# Patient Record
Sex: Female | Born: 1935 | Race: White | Hispanic: No | Marital: Married | Smoking: Never smoker
Health system: Southern US, Community
[De-identification: ages and names within clinical notes are randomized; demographics above are authoritative.]

## PROBLEM LIST (undated history)

## (undated) DIAGNOSIS — E079 Disorder of thyroid, unspecified: Secondary | ICD-10-CM

## (undated) DIAGNOSIS — F419 Anxiety disorder, unspecified: Secondary | ICD-10-CM

---

## 2004-09-17 ENCOUNTER — Ambulatory Visit: Payer: Self-pay | Admitting: Internal Medicine

## 2005-10-14 ENCOUNTER — Ambulatory Visit: Payer: Self-pay | Admitting: Internal Medicine

## 2005-10-19 ENCOUNTER — Ambulatory Visit: Payer: Self-pay | Admitting: Internal Medicine

## 2006-01-19 ENCOUNTER — Ambulatory Visit: Payer: Self-pay | Admitting: Internal Medicine

## 2006-01-19 ENCOUNTER — Ambulatory Visit: Payer: Self-pay | Admitting: Nurse Practitioner

## 2006-01-26 ENCOUNTER — Ambulatory Visit: Payer: Self-pay | Admitting: Internal Medicine

## 2006-05-20 ENCOUNTER — Ambulatory Visit: Payer: Self-pay | Admitting: Unknown Physician Specialty

## 2006-10-21 ENCOUNTER — Ambulatory Visit: Payer: Self-pay | Admitting: Internal Medicine

## 2008-01-23 ENCOUNTER — Ambulatory Visit: Payer: Self-pay | Admitting: Internal Medicine

## 2009-01-23 ENCOUNTER — Ambulatory Visit: Payer: Self-pay | Admitting: Internal Medicine

## 2009-02-13 ENCOUNTER — Ambulatory Visit: Payer: Self-pay | Admitting: Unknown Physician Specialty

## 2009-02-27 ENCOUNTER — Ambulatory Visit: Payer: Self-pay | Admitting: Unknown Physician Specialty

## 2009-05-16 ENCOUNTER — Ambulatory Visit: Payer: Self-pay | Admitting: Internal Medicine

## 2010-01-20 ENCOUNTER — Ambulatory Visit: Payer: Self-pay | Admitting: Unknown Physician Specialty

## 2010-01-21 LAB — PATHOLOGY REPORT

## 2010-02-19 ENCOUNTER — Ambulatory Visit: Payer: Self-pay | Admitting: Internal Medicine

## 2011-04-01 ENCOUNTER — Ambulatory Visit: Payer: Self-pay | Admitting: Internal Medicine

## 2011-04-19 ENCOUNTER — Ambulatory Visit: Payer: Self-pay | Admitting: Internal Medicine

## 2011-08-25 ENCOUNTER — Ambulatory Visit: Payer: Self-pay | Admitting: Gastroenterology

## 2011-08-26 ENCOUNTER — Ambulatory Visit: Payer: Self-pay | Admitting: Gastroenterology

## 2011-08-28 ENCOUNTER — Emergency Department: Payer: Self-pay | Admitting: Emergency Medicine

## 2011-08-28 LAB — COMPREHENSIVE METABOLIC PANEL
Albumin: 3.7 g/dL (ref 3.4–5.0)
Alkaline Phosphatase: 40 U/L — ABNORMAL LOW (ref 50–136)
Anion Gap: 8 (ref 7–16)
BUN: 9 mg/dL (ref 7–18)
Calcium, Total: 8.5 mg/dL (ref 8.5–10.1)
Chloride: 102 mmol/L (ref 98–107)
EGFR (African American): >60
EGFR (Non-African Amer.): >60
Potassium: 3.5 mmol/L (ref 3.5–5.1)
SGOT(AST): 23 U/L (ref 15–37)

## 2011-08-28 LAB — URINALYSIS, COMPLETE
Bilirubin,UR: NEGATIVE
Glucose,UR: NEGATIVE mg/dL (ref 0–75)
Leukocyte Esterase: NEGATIVE
Nitrite: NEGATIVE
Ph: 6 (ref 4.5–8.0)
Protein: NEGATIVE
RBC,UR: 1 /HPF (ref 0–5)
Squamous Epithelial: NONE SEEN

## 2011-08-28 LAB — CK TOTAL AND CKMB (NOT AT ARMC): CK, Total: 62 U/L (ref 21–215)

## 2011-08-28 LAB — CBC
HGB: 13.1 g/dL (ref 12.0–16.0)
MCHC: 34.2 g/dL (ref 32.0–36.0)
RBC: 4.11 10*6/uL (ref 3.80–5.20)

## 2011-08-28 LAB — TROPONIN I: Troponin-I: <0.02 ng/mL

## 2011-09-06 ENCOUNTER — Ambulatory Visit: Payer: Self-pay | Admitting: Internal Medicine

## 2011-09-13 ENCOUNTER — Ambulatory Visit: Payer: Self-pay | Admitting: Internal Medicine

## 2012-01-04 ENCOUNTER — Ambulatory Visit: Payer: Self-pay | Admitting: Gastroenterology

## 2012-01-05 LAB — PATHOLOGY REPORT

## 2012-02-07 ENCOUNTER — Ambulatory Visit: Payer: Self-pay | Admitting: Gastroenterology

## 2012-02-16 ENCOUNTER — Ambulatory Visit: Payer: Self-pay | Admitting: Internal Medicine

## 2012-03-27 ENCOUNTER — Ambulatory Visit: Payer: Self-pay | Admitting: Internal Medicine

## 2012-04-19 ENCOUNTER — Ambulatory Visit: Payer: Self-pay | Admitting: Internal Medicine

## 2012-05-09 ENCOUNTER — Ambulatory Visit: Payer: Self-pay | Admitting: Gastroenterology

## 2013-04-20 ENCOUNTER — Ambulatory Visit: Payer: Self-pay | Admitting: Internal Medicine

## 2013-05-06 IMAGING — US ABDOMEN ULTRASOUND
1 series · 17 of 25 positions shown · non-contrast
Comparison: none

REASON FOR EXAM: epigastric abd pain
COMMENTS:

[Series 1: abdomen ultrasound · 17 of 85 slices shown]
[im 1/85]
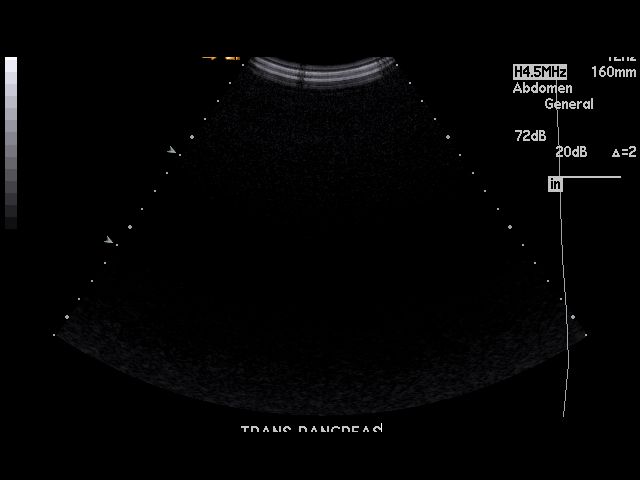
[im 8/85]
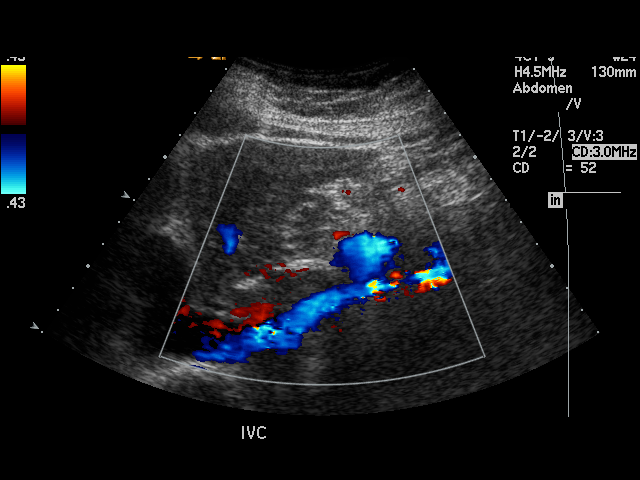
[im 11/85]
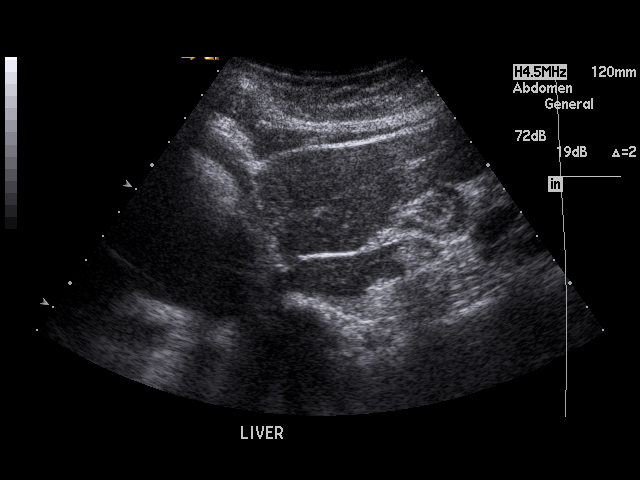
[im 18/85]
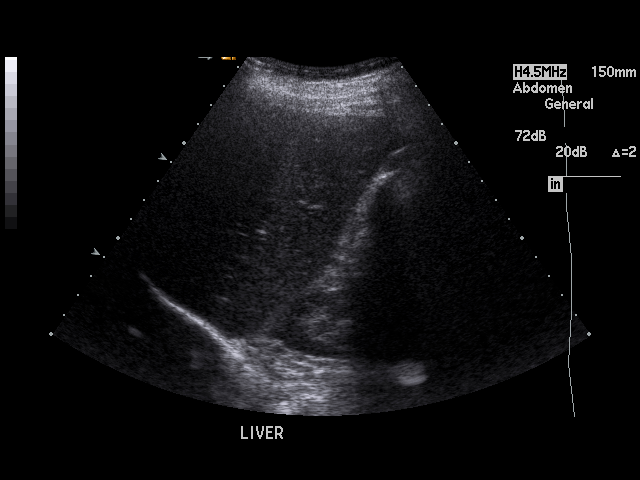
[im 22/85]
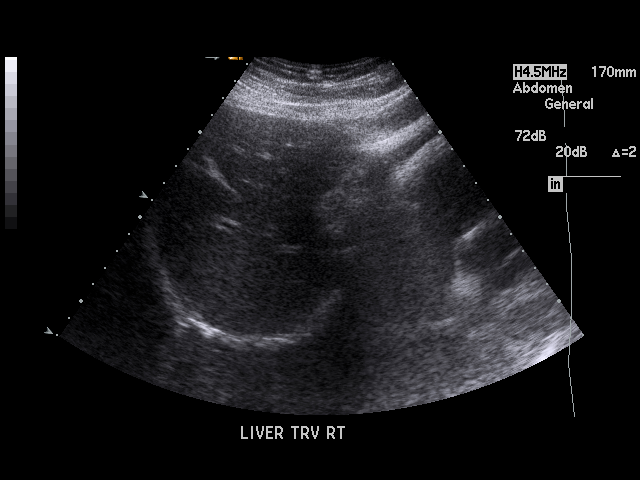
[im 29/85]
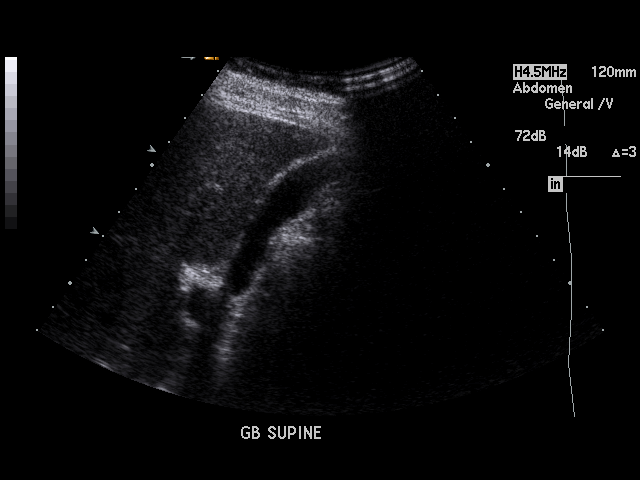
[im 32/85]
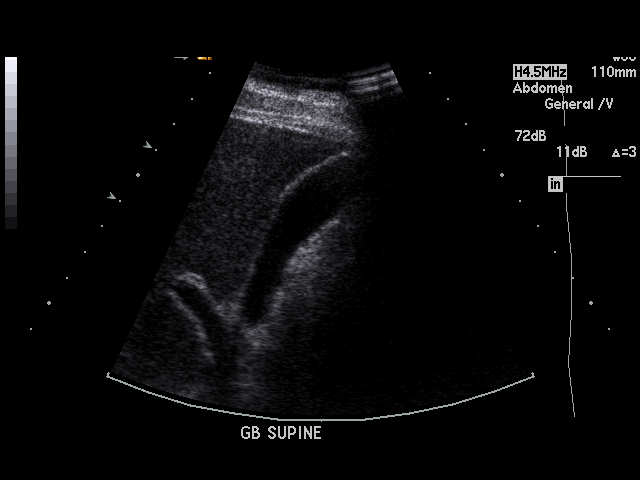
[im 39/85]
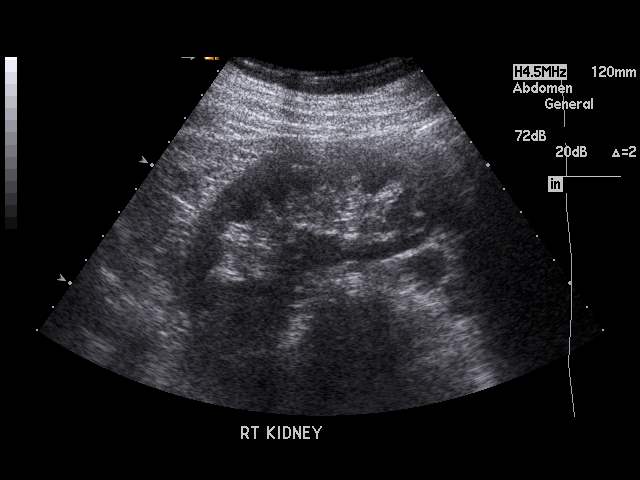
[im 43/85]
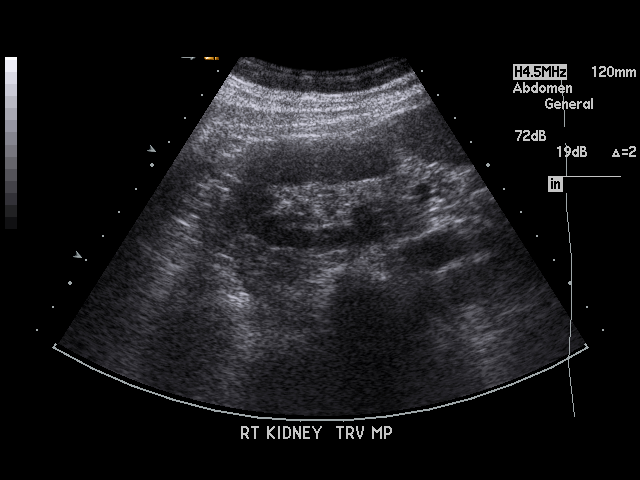
[im 46/85]
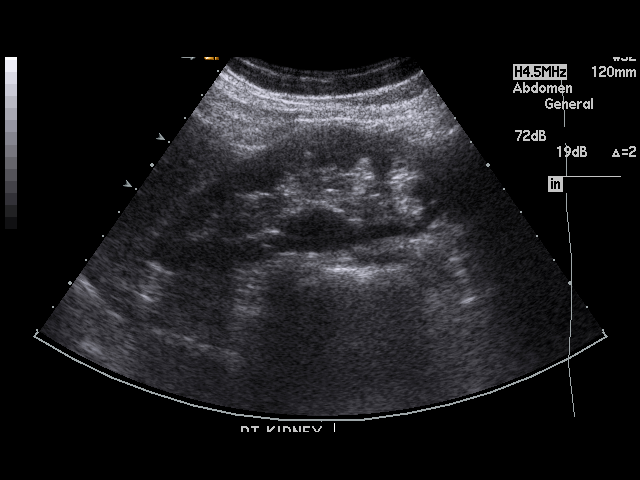
[im 53/85]
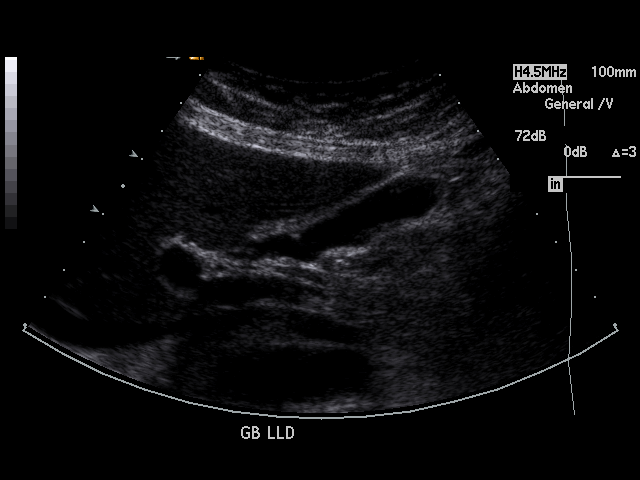
[im 57/85]
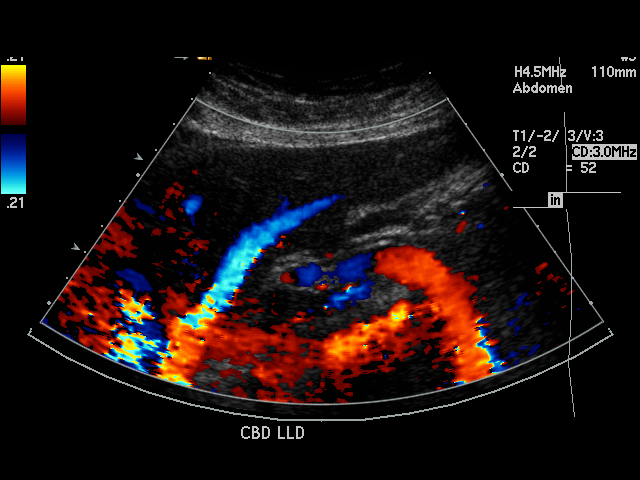
[im 64/85]
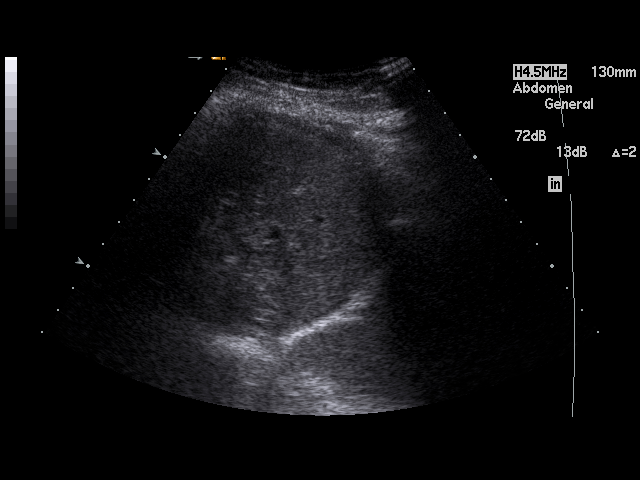
[im 67/85]
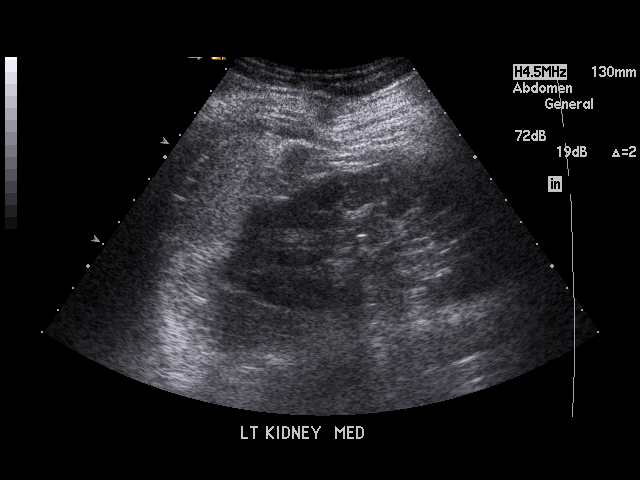
[im 74/85]
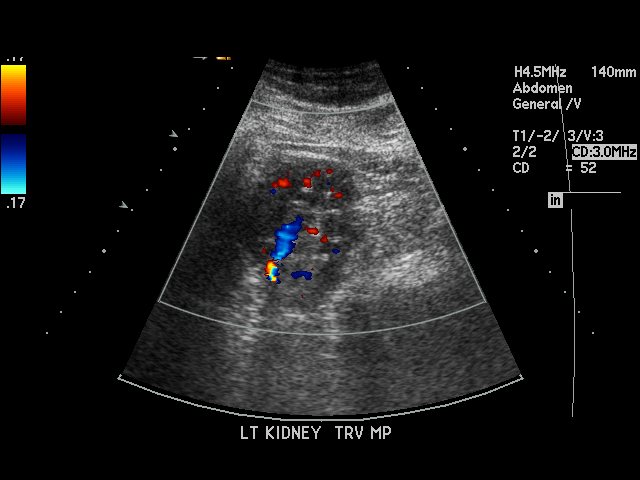
[im 78/85]
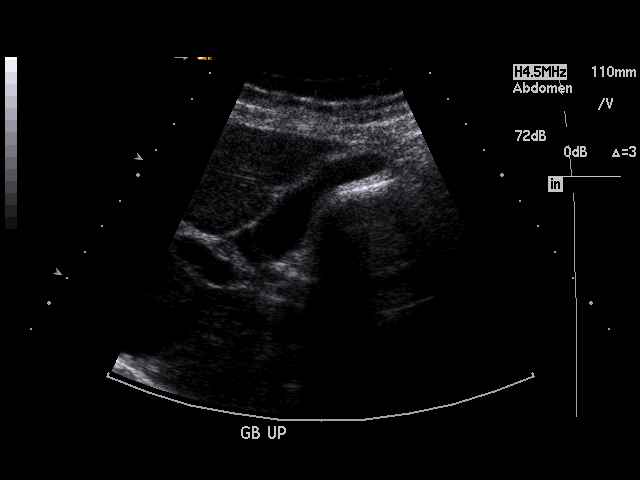
[im 85/85]
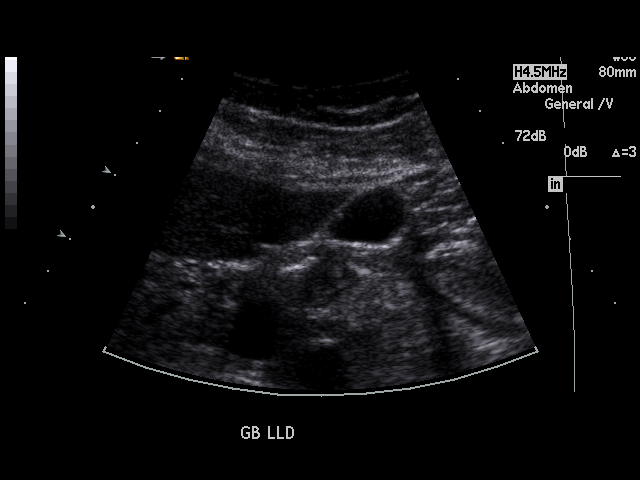

[17 of 25 positions shown; findings below may reference images not displayed]

PROCEDURE:     US  - US ABDOMEN GENERAL SURVEY  - August 25, 2011  [DATE]

RESULT:     The liver, spleen, pancreas, abdominal aorta and inferior vena
cava show no significant abnormalities. There is a 2.2 mm nonshadowing
nonmobile echodensity in the gallbladder compatible a small polyp. No
gallstones are seen. There is no thickening of the gallbladder wall. Common
bile duct measures 2.6 mm in diameter which is within normal limits. The
kidneys show no hydronephrosis. Sagittally, the right kidney measures
cm and the left measures 10.77 cm. No ascites is seen.
IMPRESSION: 1. There is a tiny nonshadowing echodensity in the gallbladder compatible
with a 2.2 mm polyp.
2. No gallstones or other acute change is identified.

## 2013-05-07 IMAGING — NM NUCLEAR MEDICINE HEPATOHBILIARY INCLUDE GB
2 series · 13 of 13 positions shown · non-contrast
Comparison: none

REASON FOR EXAM: epigastric abd pain
COMMENTS:

[Series 1000: gallbladder ef · 4.80mm/px · 6 of 30 frames shown]
[frame 3/30]
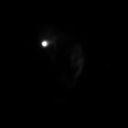
[frame 8/30]
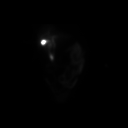
[frame 13/30]
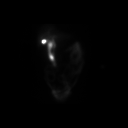
[frame 18/30]
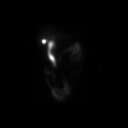
[frame 23/30]
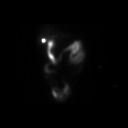
[frame 28/30]
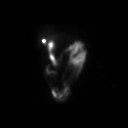

[Series 1000: gallbladder statics · 4.80mm/px · 7 of 7 slices shown]
[im 1/7]
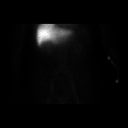
[im 2/7]
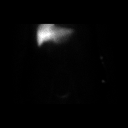
[im 3/7]
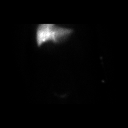
[im 4/7]
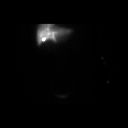
[im 5/7]
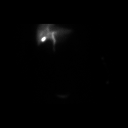
[im 6/7]
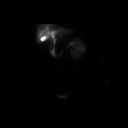
[im 7/7]
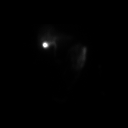

[13 of 13 positions shown; findings below may reference images not displayed]

PROCEDURE:     NM  - NM HEPATO WITH GB EJECT FRACTION  - August 26, 2011 [DATE]

RESULT:     Following intravenous administration of 7.87 mCi technetium 99m
Choletec, there is observed prompt visualization of tracer activity in the
liver at 3 minutes. At 40 minutes, tracer activity is visualized in the
gallbladder, common duct and proximal small bowel.

The gallbladder ejection fraction measures 90% which is within the normal
range.
IMPRESSION: 1. Normal hepatobiliary scan.
2. The gallbladder ejection fraction measures 90% which is in the normal
range.

## 2014-07-15 ENCOUNTER — Ambulatory Visit: Payer: Self-pay | Admitting: Internal Medicine

## 2014-07-15 DIAGNOSIS — Z1231 Encounter for screening mammogram for malignant neoplasm of breast: Secondary | ICD-10-CM | POA: Diagnosis not present

## 2014-10-11 DIAGNOSIS — K210 Gastro-esophageal reflux disease with esophagitis: Secondary | ICD-10-CM | POA: Diagnosis not present

## 2014-10-17 DIAGNOSIS — M546 Pain in thoracic spine: Secondary | ICD-10-CM | POA: Diagnosis not present

## 2014-10-17 DIAGNOSIS — Z79899 Other long term (current) drug therapy: Secondary | ICD-10-CM | POA: Diagnosis not present

## 2014-10-17 DIAGNOSIS — M791 Myalgia: Secondary | ICD-10-CM | POA: Diagnosis not present

## 2014-10-17 DIAGNOSIS — E039 Hypothyroidism, unspecified: Secondary | ICD-10-CM | POA: Diagnosis not present

## 2014-10-17 DIAGNOSIS — F419 Anxiety disorder, unspecified: Secondary | ICD-10-CM | POA: Diagnosis not present

## 2014-10-17 DIAGNOSIS — E780 Pure hypercholesterolemia: Secondary | ICD-10-CM | POA: Diagnosis not present

## 2014-12-03 DIAGNOSIS — F329 Major depressive disorder, single episode, unspecified: Secondary | ICD-10-CM | POA: Diagnosis not present

## 2014-12-05 DIAGNOSIS — E039 Hypothyroidism, unspecified: Secondary | ICD-10-CM | POA: Diagnosis not present

## 2014-12-05 DIAGNOSIS — Z79899 Other long term (current) drug therapy: Secondary | ICD-10-CM | POA: Diagnosis not present

## 2014-12-05 DIAGNOSIS — I10 Essential (primary) hypertension: Secondary | ICD-10-CM | POA: Diagnosis not present

## 2014-12-05 DIAGNOSIS — E785 Hyperlipidemia, unspecified: Secondary | ICD-10-CM | POA: Diagnosis not present

## 2014-12-12 DIAGNOSIS — E039 Hypothyroidism, unspecified: Secondary | ICD-10-CM | POA: Diagnosis not present

## 2014-12-12 DIAGNOSIS — I10 Essential (primary) hypertension: Secondary | ICD-10-CM | POA: Diagnosis not present

## 2014-12-12 DIAGNOSIS — R252 Cramp and spasm: Secondary | ICD-10-CM | POA: Diagnosis not present

## 2014-12-12 DIAGNOSIS — E780 Pure hypercholesterolemia: Secondary | ICD-10-CM | POA: Diagnosis not present

## 2015-03-06 DIAGNOSIS — E780 Pure hypercholesterolemia: Secondary | ICD-10-CM | POA: Diagnosis not present

## 2015-03-06 DIAGNOSIS — E039 Hypothyroidism, unspecified: Secondary | ICD-10-CM | POA: Diagnosis not present

## 2015-03-12 DIAGNOSIS — H2513 Age-related nuclear cataract, bilateral: Secondary | ICD-10-CM | POA: Diagnosis not present

## 2015-03-13 ENCOUNTER — Other Ambulatory Visit: Payer: Self-pay | Admitting: Internal Medicine

## 2015-03-13 DIAGNOSIS — F419 Anxiety disorder, unspecified: Secondary | ICD-10-CM | POA: Diagnosis not present

## 2015-03-13 DIAGNOSIS — R102 Pelvic and perineal pain unspecified side: Secondary | ICD-10-CM

## 2015-03-13 DIAGNOSIS — E780 Pure hypercholesterolemia: Secondary | ICD-10-CM | POA: Diagnosis not present

## 2015-03-13 DIAGNOSIS — R1084 Generalized abdominal pain: Secondary | ICD-10-CM | POA: Diagnosis not present

## 2015-03-13 DIAGNOSIS — R079 Chest pain, unspecified: Secondary | ICD-10-CM | POA: Diagnosis not present

## 2015-03-13 DIAGNOSIS — I10 Essential (primary) hypertension: Secondary | ICD-10-CM | POA: Diagnosis not present

## 2015-03-13 DIAGNOSIS — E039 Hypothyroidism, unspecified: Secondary | ICD-10-CM | POA: Diagnosis not present

## 2015-03-19 ENCOUNTER — Ambulatory Visit
Admission: RE | Admit: 2015-03-19 | Discharge: 2015-03-19 | Disposition: A | Discharge status: Home / Self Care | Payer: Commercial Managed Care - HMO | Source: Ambulatory Visit | Attending: Internal Medicine | Admitting: Internal Medicine

## 2015-03-19 DIAGNOSIS — R1084 Generalized abdominal pain: Secondary | ICD-10-CM | POA: Diagnosis not present

## 2015-03-19 DIAGNOSIS — M5137 Other intervertebral disc degeneration, lumbosacral region: Secondary | ICD-10-CM | POA: Insufficient documentation

## 2015-03-19 DIAGNOSIS — R102 Pelvic and perineal pain: Secondary | ICD-10-CM | POA: Diagnosis not present

## 2015-03-19 DIAGNOSIS — R197 Diarrhea, unspecified: Secondary | ICD-10-CM | POA: Diagnosis not present

## 2015-03-19 DIAGNOSIS — R1010 Upper abdominal pain, unspecified: Secondary | ICD-10-CM | POA: Diagnosis not present

## 2015-03-19 DIAGNOSIS — M5136 Other intervertebral disc degeneration, lumbar region: Secondary | ICD-10-CM | POA: Insufficient documentation

## 2015-03-19 DIAGNOSIS — N83201 Unspecified ovarian cyst, right side: Secondary | ICD-10-CM | POA: Diagnosis not present

## 2015-03-19 LAB — POCT I-STAT CREATININE: CREATININE: 0.7 mg/dL (ref 0.44–1.00)

## 2015-03-19 MED ORDER — IOHEXOL 300 MG/ML  SOLN
100.0000 mL | Freq: Once | INTRAMUSCULAR | Status: AC | PRN
  Administered 2015-03-19: 100 mL via INTRAVENOUS

## 2015-03-21 DIAGNOSIS — Z23 Encounter for immunization: Secondary | ICD-10-CM | POA: Diagnosis not present

## 2015-03-25 DIAGNOSIS — F329 Major depressive disorder, single episode, unspecified: Secondary | ICD-10-CM | POA: Diagnosis not present

## 2015-04-07 DIAGNOSIS — E782 Mixed hyperlipidemia: Secondary | ICD-10-CM | POA: Diagnosis not present

## 2015-04-07 DIAGNOSIS — R079 Chest pain, unspecified: Secondary | ICD-10-CM | POA: Diagnosis not present

## 2015-04-15 DIAGNOSIS — R079 Chest pain, unspecified: Secondary | ICD-10-CM | POA: Diagnosis not present

## 2015-05-12 ENCOUNTER — Encounter: Payer: Self-pay | Admitting: Family

## 2015-08-28 DIAGNOSIS — E039 Hypothyroidism, unspecified: Secondary | ICD-10-CM | POA: Diagnosis not present

## 2015-08-28 DIAGNOSIS — Z79899 Other long term (current) drug therapy: Secondary | ICD-10-CM | POA: Diagnosis not present

## 2015-08-28 DIAGNOSIS — E7800 Pure hypercholesterolemia, unspecified: Secondary | ICD-10-CM | POA: Diagnosis not present

## 2015-09-03 DIAGNOSIS — E039 Hypothyroidism, unspecified: Secondary | ICD-10-CM | POA: Diagnosis not present

## 2015-09-03 DIAGNOSIS — E7800 Pure hypercholesterolemia, unspecified: Secondary | ICD-10-CM | POA: Diagnosis not present

## 2015-09-03 DIAGNOSIS — Z1211 Encounter for screening for malignant neoplasm of colon: Secondary | ICD-10-CM | POA: Diagnosis not present

## 2015-09-03 DIAGNOSIS — I10 Essential (primary) hypertension: Secondary | ICD-10-CM | POA: Diagnosis not present

## 2015-09-03 DIAGNOSIS — Z1239 Encounter for other screening for malignant neoplasm of breast: Secondary | ICD-10-CM | POA: Diagnosis not present

## 2015-09-04 ENCOUNTER — Other Ambulatory Visit: Payer: Self-pay | Admitting: Internal Medicine

## 2015-09-04 DIAGNOSIS — Z1231 Encounter for screening mammogram for malignant neoplasm of breast: Secondary | ICD-10-CM

## 2015-09-08 DIAGNOSIS — Z1211 Encounter for screening for malignant neoplasm of colon: Secondary | ICD-10-CM | POA: Diagnosis not present

## 2015-09-22 ENCOUNTER — Other Ambulatory Visit: Payer: Self-pay | Admitting: Internal Medicine

## 2015-09-22 ENCOUNTER — Ambulatory Visit
Admission: RE | Admit: 2015-09-22 | Discharge: 2015-09-22 | Disposition: A | Discharge status: Home / Self Care | Payer: Commercial Managed Care - HMO | Source: Ambulatory Visit | Attending: Internal Medicine | Admitting: Internal Medicine

## 2015-09-22 DIAGNOSIS — Z1231 Encounter for screening mammogram for malignant neoplasm of breast: Secondary | ICD-10-CM | POA: Diagnosis not present

## 2015-09-30 DIAGNOSIS — F329 Major depressive disorder, single episode, unspecified: Secondary | ICD-10-CM | POA: Diagnosis not present

## 2015-12-23 DIAGNOSIS — F329 Major depressive disorder, single episode, unspecified: Secondary | ICD-10-CM | POA: Diagnosis not present

## 2016-01-21 DIAGNOSIS — F329 Major depressive disorder, single episode, unspecified: Secondary | ICD-10-CM | POA: Diagnosis not present

## 2016-02-04 DIAGNOSIS — G4733 Obstructive sleep apnea (adult) (pediatric): Secondary | ICD-10-CM | POA: Diagnosis not present

## 2016-02-04 DIAGNOSIS — R0683 Snoring: Secondary | ICD-10-CM | POA: Diagnosis not present

## 2016-02-04 DIAGNOSIS — G4700 Insomnia, unspecified: Secondary | ICD-10-CM | POA: Diagnosis not present

## 2016-02-04 DIAGNOSIS — R5383 Other fatigue: Secondary | ICD-10-CM | POA: Diagnosis not present

## 2016-03-09 DIAGNOSIS — Z79899 Other long term (current) drug therapy: Secondary | ICD-10-CM | POA: Diagnosis not present

## 2016-03-09 DIAGNOSIS — E039 Hypothyroidism, unspecified: Secondary | ICD-10-CM | POA: Diagnosis not present

## 2016-03-09 DIAGNOSIS — I10 Essential (primary) hypertension: Secondary | ICD-10-CM | POA: Diagnosis not present

## 2016-03-09 DIAGNOSIS — F419 Anxiety disorder, unspecified: Secondary | ICD-10-CM | POA: Diagnosis not present

## 2016-03-09 DIAGNOSIS — E7800 Pure hypercholesterolemia, unspecified: Secondary | ICD-10-CM | POA: Diagnosis not present

## 2016-03-09 DIAGNOSIS — Z0000 Encounter for general adult medical examination without abnormal findings: Secondary | ICD-10-CM | POA: Diagnosis not present

## 2016-03-18 DIAGNOSIS — F329 Major depressive disorder, single episode, unspecified: Secondary | ICD-10-CM | POA: Diagnosis not present

## 2016-05-20 DIAGNOSIS — F329 Major depressive disorder, single episode, unspecified: Secondary | ICD-10-CM | POA: Diagnosis not present

## 2016-06-29 DIAGNOSIS — F329 Major depressive disorder, single episode, unspecified: Secondary | ICD-10-CM | POA: Diagnosis not present

## 2016-08-10 DIAGNOSIS — I10 Essential (primary) hypertension: Secondary | ICD-10-CM | POA: Diagnosis not present

## 2016-08-10 DIAGNOSIS — G4733 Obstructive sleep apnea (adult) (pediatric): Secondary | ICD-10-CM | POA: Diagnosis not present

## 2016-08-10 DIAGNOSIS — F5104 Psychophysiologic insomnia: Secondary | ICD-10-CM | POA: Diagnosis not present

## 2016-08-26 DIAGNOSIS — G4733 Obstructive sleep apnea (adult) (pediatric): Secondary | ICD-10-CM | POA: Diagnosis not present

## 2016-09-21 DIAGNOSIS — F329 Major depressive disorder, single episode, unspecified: Secondary | ICD-10-CM | POA: Diagnosis not present

## 2016-10-04 DIAGNOSIS — E039 Hypothyroidism, unspecified: Secondary | ICD-10-CM | POA: Diagnosis not present

## 2016-10-04 DIAGNOSIS — Z79899 Other long term (current) drug therapy: Secondary | ICD-10-CM | POA: Diagnosis not present

## 2016-10-04 DIAGNOSIS — E7800 Pure hypercholesterolemia, unspecified: Secondary | ICD-10-CM | POA: Diagnosis not present

## 2016-10-04 DIAGNOSIS — Z0000 Encounter for general adult medical examination without abnormal findings: Secondary | ICD-10-CM | POA: Diagnosis not present

## 2016-10-04 DIAGNOSIS — I10 Essential (primary) hypertension: Secondary | ICD-10-CM | POA: Diagnosis not present

## 2016-10-11 DIAGNOSIS — Z1211 Encounter for screening for malignant neoplasm of colon: Secondary | ICD-10-CM | POA: Diagnosis not present

## 2016-10-11 DIAGNOSIS — Z0000 Encounter for general adult medical examination without abnormal findings: Secondary | ICD-10-CM | POA: Diagnosis not present

## 2016-10-11 DIAGNOSIS — E039 Hypothyroidism, unspecified: Secondary | ICD-10-CM | POA: Diagnosis not present

## 2016-10-11 DIAGNOSIS — E7800 Pure hypercholesterolemia, unspecified: Secondary | ICD-10-CM | POA: Diagnosis not present

## 2016-10-11 DIAGNOSIS — G4733 Obstructive sleep apnea (adult) (pediatric): Secondary | ICD-10-CM | POA: Diagnosis not present

## 2016-10-11 DIAGNOSIS — F419 Anxiety disorder, unspecified: Secondary | ICD-10-CM | POA: Diagnosis not present

## 2016-10-11 DIAGNOSIS — Z1231 Encounter for screening mammogram for malignant neoplasm of breast: Secondary | ICD-10-CM | POA: Diagnosis not present

## 2016-10-11 DIAGNOSIS — F5104 Psychophysiologic insomnia: Secondary | ICD-10-CM | POA: Diagnosis not present

## 2016-10-11 DIAGNOSIS — I10 Essential (primary) hypertension: Secondary | ICD-10-CM | POA: Diagnosis not present

## 2016-10-14 ENCOUNTER — Other Ambulatory Visit: Payer: Self-pay | Admitting: Internal Medicine

## 2016-10-14 DIAGNOSIS — Z1231 Encounter for screening mammogram for malignant neoplasm of breast: Secondary | ICD-10-CM

## 2016-10-19 DIAGNOSIS — Z1211 Encounter for screening for malignant neoplasm of colon: Secondary | ICD-10-CM | POA: Diagnosis not present

## 2016-10-28 DIAGNOSIS — F329 Major depressive disorder, single episode, unspecified: Secondary | ICD-10-CM | POA: Diagnosis not present

## 2016-11-15 ENCOUNTER — Ambulatory Visit
Admission: RE | Admit: 2016-11-15 | Discharge: 2016-11-15 | Disposition: A | Discharge status: Home / Self Care | Payer: Commercial Managed Care - HMO | Source: Ambulatory Visit | Attending: Internal Medicine | Admitting: Internal Medicine

## 2016-11-15 DIAGNOSIS — Z1231 Encounter for screening mammogram for malignant neoplasm of breast: Secondary | ICD-10-CM | POA: Insufficient documentation

## 2017-01-19 ENCOUNTER — Emergency Department: Payer: Medicare HMO

## 2017-01-19 ENCOUNTER — Emergency Department
Admission: EM | Admit: 2017-01-19 | Discharge: 2017-01-19 | Disposition: A | Discharge status: Home / Self Care | Payer: Medicare HMO | Attending: Emergency Medicine | Admitting: Emergency Medicine

## 2017-01-19 DIAGNOSIS — R110 Nausea: Secondary | ICD-10-CM | POA: Diagnosis not present

## 2017-01-19 DIAGNOSIS — R42 Dizziness and giddiness: Secondary | ICD-10-CM | POA: Insufficient documentation

## 2017-01-19 DIAGNOSIS — E039 Hypothyroidism, unspecified: Secondary | ICD-10-CM | POA: Diagnosis not present

## 2017-01-19 DIAGNOSIS — I10 Essential (primary) hypertension: Secondary | ICD-10-CM | POA: Insufficient documentation

## 2017-01-19 HISTORY — DX: Disorder of thyroid, unspecified: E07.9

## 2017-01-19 HISTORY — DX: Anxiety disorder, unspecified: F41.9

## 2017-01-19 LAB — BASIC METABOLIC PANEL
Anion gap: 11 (ref 5–15)
BUN: 8 mg/dL (ref 6–20)
CALCIUM: 9.4 mg/dL (ref 8.9–10.3)
CO2: 23 mmol/L (ref 22–32)
Chloride: 102 mmol/L (ref 101–111)
Creatinine, Ser: 0.67 mg/dL (ref 0.44–1.00)
GFR calc non Af Amer: >60 mL/min (ref >60)
Glucose, Bld: 126 mg/dL — ABNORMAL HIGH (ref 65–99)
Potassium: 3.9 mmol/L (ref 3.5–5.1)
SODIUM: 136 mmol/L (ref 135–145)

## 2017-01-19 LAB — CBC
HCT: 40.3 % (ref 35.0–47.0)
Hemoglobin: 13.4 g/dL (ref 12.0–16.0)
MCH: 30.3 pg (ref 26.0–34.0)
MCHC: 33.3 g/dL (ref 32.0–36.0)
MCV: 91.2 fL (ref 80.0–100.0)
PLATELETS: 167 10*3/uL (ref 150–440)
RBC: 4.42 MIL/uL (ref 3.80–5.20)
RDW: 13.2 % (ref 11.5–14.5)
WBC: 5.4 10*3/uL (ref 3.6–11.0)

## 2017-01-19 LAB — URINALYSIS, COMPLETE (UACMP) WITH MICROSCOPIC
BILIRUBIN URINE: NEGATIVE
Bacteria, UA: NONE SEEN
GLUCOSE, UA: NEGATIVE mg/dL
Hgb urine dipstick: NEGATIVE
KETONES UR: NEGATIVE mg/dL
LEUKOCYTES UA: NEGATIVE
Nitrite: NEGATIVE
PH: 7 (ref 5.0–8.0)
Protein, ur: NEGATIVE mg/dL
RBC / HPF: NONE SEEN RBC/hpf (ref 0–5)
SPECIFIC GRAVITY, URINE: 1.006 (ref 1.005–1.030)
Squamous Epithelial / LPF: NONE SEEN

## 2017-01-19 MED ORDER — ONDANSETRON HCL 4 MG/2ML IJ SOLN
4.0000 mg | Freq: Once | INTRAMUSCULAR | Status: AC
  Administered 2017-01-19: 4 mg via INTRAVENOUS
  Filled 2017-01-19: qty 2

## 2017-01-19 MED ORDER — MECLIZINE HCL 25 MG PO TABS
25.0000 mg | ORAL_TABLET | Freq: Once | ORAL | Status: AC
  Administered 2017-01-19: 25 mg via ORAL
  Filled 2017-01-19: qty 1

## 2017-01-19 MED ORDER — DIAZEPAM 5 MG PO TABS
5.0000 mg | ORAL_TABLET | Freq: Once | ORAL | Status: AC
  Administered 2017-01-19: 5 mg via ORAL
  Filled 2017-01-19: qty 1

## 2017-01-19 MED ORDER — DIAZEPAM 2 MG PO TABS: 2.0000 mg | ORAL_TABLET | Freq: Three times a day (TID) | ORAL | 0 refills | Status: AC | PRN

## 2017-01-19 MED ORDER — SODIUM CHLORIDE 0.9 % IV BOLUS (SEPSIS)
1000.0000 mL | Freq: Once | INTRAVENOUS | Status: AC
  Administered 2017-01-19: 1000 mL via INTRAVENOUS

## 2017-01-19 MED ORDER — MECLIZINE HCL 25 MG PO TABS: 25.0000 mg | ORAL_TABLET | Freq: Three times a day (TID) | ORAL | 0 refills | Status: AC | PRN

## 2017-01-19 NOTE — ED Notes (Signed)
Patient transported to MRI 

## 2017-01-19 NOTE — ED Notes (Signed)
Pt discharged to home.  Family member driving.  Discharge instructions reviewed.  Verbalized understanding.  No questions or concerns at this time.  Teach back verified.  Pt in NAD.  No items left in ED.   Signature pad not working, pt verbalized understanding.

## 2017-01-19 NOTE — ED Notes (Signed)
Patient transported to CT 

## 2017-01-19 NOTE — Discharge Instructions (Signed)
Take meclizine 3 times a day as needed for vertigo. You may take Valium as prescribed however this medication is very sedating and should be taken with caution. Follow up with ENT in a week. Performed the exercises given to you as needed. Return to the emergency room if you have headache, facial droop, slurred speech, difficulty finding words, or weakness or numbness of one side of her body.

## 2017-01-19 NOTE — ED Provider Notes (Signed)
[facility] Emergency Department Provider Note  ____________________________________________  Time seen: Approximately 12:57 PM  I have reviewed the triage vital signs and the nursing notes.   HISTORY  Chief Complaint Dizziness   HPI Shirley Ellis is a 81 y.o. female with a history of OSA, hypothyroidism, hypertension, hyperlipidemia, anxiety who presents for evaluation of vertigo. Patient reports that she went for a walk with her husband. She came back in the house at 9:30 AM. She started feeling nauseated and lay on the couch. When she tried to get up she started having vertigo and was unable to walk. That made her nausea worse. No vomiting, no facial droop, no slurred speech, no difficulty finding words, no dysarthria, diplopia, dysphagia, or unilateral weakness or numbness. Patient has a history of smoking however quit several years ago. No personal or family history of stroke. Vertigo resolves at rest and recurs with movement. Nausea has been constant.  Past Medical History:  Diagnosis Date  . Anxiety   . Thyroid disease     There are no active problems to display for this patient.   History reviewed. No pertinent surgical history.  Prior to Admission medications   Medication Sig Start Date End Date Taking? Authorizing Provider  diazepam (VALIUM) 2 MG tablet Take 1 tablet (2 mg total) by mouth every 8 (eight) hours as needed for anxiety. 01/19/17 01/19/18  Nita SickleVeronese, [place], MD  meclizine (ANTIVERT) 25 MG tablet Take 1 tablet (25 mg total) by mouth 3 (three) times daily as needed for dizziness. 01/19/17   Nita SickleVeronese, Melba, MD    Allergies Patient has no known allergies.  Family History  Problem Relation Age of Onset  . Breast cancer Other     Social History Social History  Substance Use Topics  . Smoking status: Never Smoker  . Smokeless tobacco: Current User  . Alcohol use No    Review of Systems  Constitutional: Negative for  fever. Eyes: Negative for visual changes. ENT: Negative for sore throat. Neck: No neck pain  Cardiovascular: Negative for chest pain. Respiratory: Negative for shortness of breath. Gastrointestinal: Negative for abdominal pain, vomiting or diarrhea. + nausea Genitourinary: Negative for dysuria. Musculoskeletal: Negative for back pain. Skin: Negative for rash. Neurological: Negative for headaches, weakness or numbness. + vertigo Psych: No SI or HI  ____________________________________________   PHYSICAL EXAM:  VITAL SIGNS: ED Triage Vitals [01/19/17 1221]  Enc Vitals Group     BP (!) 168/72     Pulse Rate 76     Resp 18     Temp 97.8 F (36.6 C)     Temp Source Oral     SpO2 99 %     Weight 138 lb (62.6 kg)     Height 5\' 3"  (1.6 m)     Head Circumference      Peak Flow      Pain Score      Pain Loc      Pain Edu?      Excl. in GC?     Constitutional: Alert and oriented. Well appearing and in no apparent distress. HEENT:      Head: Normocephalic and atraumatic.         Eyes: Conjunctivae are normal. Sclera is non-icteric.       Mouth/Throat: Mucous membranes are moist.       Ear: TMs visualized and clear      Neck: Supple with no signs of meningismus. No carotid bruits Cardiovascular: Regular rate and  rhythm. No murmurs, gallops, or rubs. 2+ symmetrical distal pulses are present in all extremities. No JVD. Respiratory: Normal respiratory effort. Lungs are clear to auscultation bilaterally. No wheezes, crackles, or rhonchi.  Gastrointestinal: Soft, non tender, and non distended with positive bowel sounds. No rebound or guarding. Musculoskeletal: Nontender with normal range of motion in all extremities. No edema, cyanosis, or erythema of extremities. Neurologic: Normal speech and language. A & O x3, PERRL, no nystagmus, CN II-XII intact, motor testing reveals good tone and bulk throughout. There is no evidence of pronator drift or dysmetria. Muscle strength is 5/5  throughout. Sensory examination is intact. Gait deferred. Patient with vertigo when she sits down, no truncal ataxia while sitting. Skin: Skin is warm, dry and intact. No rash noted. Psychiatric: Mood and affect are normal. Speech and behavior are normal.  ____________________________________________   LABS (all labs ordered are listed, but only abnormal results are displayed)  Labs Reviewed  BASIC METABOLIC PANEL - Abnormal; Notable for the following:       Result Value   Glucose, Bld 126 (*)    All other components within normal limits  URINALYSIS, COMPLETE (UACMP) WITH MICROSCOPIC - Abnormal; Notable for the following:    Color, Urine YELLOW (*)    APPearance HAZY (*)    All other components within normal limits  CBC  CBG MONITORING, ED   ____________________________________________  EKG  ED ECG REPORT I, Nita Sickle, the attending physician, personally viewed and interpreted this ECG.  Normal sinus rhythm, rate of 72, normal intervals, normal axis, no ST elevations or depressions  ____________________________________________  RADIOLOGY  Head CT: negative  MRI brain: negative  ____________________________________________   PROCEDURES  Procedure(s) performed: None Procedures Critical Care performed:  None ____________________________________________   INITIAL IMPRESSION / ASSESSMENT AND PLAN / ED COURSE  81 y.o. female with a history of OSA, hypothyroidism, hypertension, hyperlipidemia, anxiety who presents for evaluation of positional vertigo of acute onset at 9:30AM. Patient is neuro intact. Vitals showing elevated BP. Will get head CT , labs, EKG, treat nausea with zofran. If CT head negative will treat for BPPV with meclizine and valium and get MRI to rule out posterior fossa stroke.   Clinical Course as of Jan 20 2027  Wed Jan 19, 2017  1958 CT and MRI with no acute findings.Patient able to ambulate no difficulty and full resolution of her symptoms.  She is to be discharged home on meclizine, Valium, Epley maneuver exercises, and referral to ENT. Discussed signs and symptoms of stroke and recommended return to the emergency room if these develop.  [CV]    Clinical Course User Index [CV] Nita Sickle, MD    Pertinent labs & imaging results that were available during my care of the patient were reviewed by me and considered in my medical decision making (see chart for details).    ____________________________________________   FINAL CLINICAL IMPRESSION(S) / ED DIAGNOSES  Final diagnoses:  Vertigo      NEW MEDICATIONS STARTED DURING THIS VISIT:  New Prescriptions   DIAZEPAM (VALIUM) 2 MG TABLET    Take 1 tablet (2 mg total) by mouth every 8 (eight) hours as needed for anxiety.   MECLIZINE (ANTIVERT) 25 MG TABLET    Take 1 tablet (25 mg total) by mouth 3 (three) times daily as needed for dizziness.     Note:  This document was prepared using Dragon voice recognition software and may include unintentional dictation errors.    Nita Sickle, MD 01/19/17 2029

## 2017-01-19 NOTE — ED Notes (Addendum)
See downtime charting. 

## 2017-01-19 NOTE — ED Notes (Signed)
ED Provider at bedside. 

## 2017-01-19 NOTE — ED Notes (Signed)
See paper chart for medications and nursing tasks performed during downtime.

## 2017-01-19 NOTE — ED Triage Notes (Addendum)
Pt arrives to ER via [facility] from home for dizziness. Dizziness began at 930AM today after pt went for walk. Pt unable to walk due to dizziness. Nausea with no emesis. No weakness in extremities. Denies CP or SOB. Pt arrives with washcloth over face. Pt has not fallen since dizziness started but states that she feels like she would fall if she stood up to ambulate. Pt alert and oriented X4, active, cooperative, pt in NAD. RR even and unlabored, color WNL.  VSS with EMS, CBG in 120's with EMS.

## 2017-02-01 DIAGNOSIS — E7800 Pure hypercholesterolemia, unspecified: Secondary | ICD-10-CM | POA: Diagnosis not present

## 2017-02-01 DIAGNOSIS — F419 Anxiety disorder, unspecified: Secondary | ICD-10-CM | POA: Diagnosis not present

## 2017-02-01 DIAGNOSIS — E039 Hypothyroidism, unspecified: Secondary | ICD-10-CM | POA: Diagnosis not present

## 2017-02-01 DIAGNOSIS — G4733 Obstructive sleep apnea (adult) (pediatric): Secondary | ICD-10-CM | POA: Diagnosis not present

## 2017-02-01 DIAGNOSIS — I10 Essential (primary) hypertension: Secondary | ICD-10-CM | POA: Diagnosis not present

## 2017-02-01 DIAGNOSIS — Z79899 Other long term (current) drug therapy: Secondary | ICD-10-CM | POA: Diagnosis not present

## 2017-02-01 DIAGNOSIS — R7309 Other abnormal glucose: Secondary | ICD-10-CM | POA: Diagnosis not present

## 2017-02-18 DIAGNOSIS — F329 Major depressive disorder, single episode, unspecified: Secondary | ICD-10-CM | POA: Diagnosis not present

## 2017-03-18 DIAGNOSIS — F329 Major depressive disorder, single episode, unspecified: Secondary | ICD-10-CM | POA: Diagnosis not present

## 2017-05-17 DIAGNOSIS — Z79899 Other long term (current) drug therapy: Secondary | ICD-10-CM | POA: Diagnosis not present

## 2017-05-17 DIAGNOSIS — I10 Essential (primary) hypertension: Secondary | ICD-10-CM | POA: Diagnosis not present

## 2017-05-17 DIAGNOSIS — E7800 Pure hypercholesterolemia, unspecified: Secondary | ICD-10-CM | POA: Diagnosis not present

## 2017-05-17 DIAGNOSIS — R7309 Other abnormal glucose: Secondary | ICD-10-CM | POA: Diagnosis not present

## 2017-05-17 DIAGNOSIS — E039 Hypothyroidism, unspecified: Secondary | ICD-10-CM | POA: Diagnosis not present

## 2017-05-24 DIAGNOSIS — E039 Hypothyroidism, unspecified: Secondary | ICD-10-CM | POA: Diagnosis not present

## 2017-05-24 DIAGNOSIS — I10 Essential (primary) hypertension: Secondary | ICD-10-CM | POA: Diagnosis not present

## 2017-05-24 DIAGNOSIS — F419 Anxiety disorder, unspecified: Secondary | ICD-10-CM | POA: Diagnosis not present

## 2017-05-24 DIAGNOSIS — Z0000 Encounter for general adult medical examination without abnormal findings: Secondary | ICD-10-CM | POA: Diagnosis not present

## 2017-05-24 DIAGNOSIS — G4733 Obstructive sleep apnea (adult) (pediatric): Secondary | ICD-10-CM | POA: Diagnosis not present

## 2017-05-24 DIAGNOSIS — E7800 Pure hypercholesterolemia, unspecified: Secondary | ICD-10-CM | POA: Diagnosis not present

## 2017-11-15 ENCOUNTER — Other Ambulatory Visit: Payer: Self-pay | Admitting: Internal Medicine

## 2017-11-15 DIAGNOSIS — Z1231 Encounter for screening mammogram for malignant neoplasm of breast: Secondary | ICD-10-CM

## 2017-12-05 ENCOUNTER — Ambulatory Visit
Admission: RE | Admit: 2017-12-05 | Discharge: 2017-12-05 | Disposition: A | Discharge status: Home / Self Care | Payer: Medicare HMO | Source: Ambulatory Visit | Attending: Internal Medicine | Admitting: Internal Medicine

## 2017-12-05 DIAGNOSIS — Z1231 Encounter for screening mammogram for malignant neoplasm of breast: Secondary | ICD-10-CM | POA: Diagnosis not present

## 2019-01-22 ENCOUNTER — Other Ambulatory Visit: Payer: Self-pay | Admitting: Internal Medicine

## 2019-01-22 DIAGNOSIS — Z1231 Encounter for screening mammogram for malignant neoplasm of breast: Secondary | ICD-10-CM

## 2019-02-22 ENCOUNTER — Ambulatory Visit
Admission: RE | Admit: 2019-02-22 | Discharge: 2019-02-22 | Disposition: A | Discharge status: Home / Self Care | Payer: Medicare HMO | Source: Ambulatory Visit | Attending: Internal Medicine | Admitting: Internal Medicine

## 2019-02-22 DIAGNOSIS — Z1231 Encounter for screening mammogram for malignant neoplasm of breast: Secondary | ICD-10-CM | POA: Diagnosis present

## 2020-03-14 ENCOUNTER — Other Ambulatory Visit: Payer: Self-pay | Admitting: Internal Medicine

## 2020-03-14 DIAGNOSIS — Z1231 Encounter for screening mammogram for malignant neoplasm of breast: Secondary | ICD-10-CM

## 2020-04-08 ENCOUNTER — Ambulatory Visit
Admission: RE | Admit: 2020-04-08 | Discharge: 2020-04-08 | Disposition: A | Discharge status: Home / Self Care | Payer: Medicare HMO | Source: Ambulatory Visit | Attending: Internal Medicine | Admitting: Internal Medicine

## 2020-04-08 ENCOUNTER — Other Ambulatory Visit: Payer: Self-pay

## 2020-04-08 DIAGNOSIS — Z1231 Encounter for screening mammogram for malignant neoplasm of breast: Secondary | ICD-10-CM

## 2021-01-08 ENCOUNTER — Other Ambulatory Visit: Payer: Self-pay | Admitting: Internal Medicine

## 2021-01-08 DIAGNOSIS — Z1231 Encounter for screening mammogram for malignant neoplasm of breast: Secondary | ICD-10-CM

## 2021-04-15 ENCOUNTER — Other Ambulatory Visit: Payer: Self-pay

## 2021-04-15 ENCOUNTER — Ambulatory Visit
Admission: RE | Admit: 2021-04-15 | Discharge: 2021-04-15 | Disposition: A | Discharge status: Home / Self Care | Payer: Medicare HMO | Source: Ambulatory Visit | Attending: Internal Medicine | Admitting: Internal Medicine

## 2021-04-15 DIAGNOSIS — Z1231 Encounter for screening mammogram for malignant neoplasm of breast: Secondary | ICD-10-CM | POA: Insufficient documentation

## 2021-12-04 ENCOUNTER — Ambulatory Visit
Admission: EM | Admit: 2021-12-04 | Discharge: 2021-12-04 | Disposition: A | Discharge status: Other Acute Inpatient Hospital | Payer: Medicare HMO

## 2021-12-27 ENCOUNTER — Emergency Department
Admission: EM | Admit: 2021-12-27 | Discharge: 2021-12-27 | Discharge status: Against Medical Advice | Payer: Medicare HMO | Attending: Emergency Medicine | Admitting: Emergency Medicine

## 2021-12-27 ENCOUNTER — Other Ambulatory Visit: Payer: Self-pay

## 2021-12-27 DIAGNOSIS — R519 Headache, unspecified: Secondary | ICD-10-CM | POA: Insufficient documentation

## 2021-12-27 DIAGNOSIS — R197 Diarrhea, unspecified: Secondary | ICD-10-CM | POA: Diagnosis present

## 2021-12-27 DIAGNOSIS — Z5321 Procedure and treatment not carried out due to patient leaving prior to being seen by health care provider: Secondary | ICD-10-CM | POA: Diagnosis not present

## 2021-12-27 LAB — COMPREHENSIVE METABOLIC PANEL
ALT: 19 U/L (ref 0–44)
AST: 19 U/L (ref 15–41)
Albumin: 4 g/dL (ref 3.5–5.0)
Alkaline Phosphatase: 52 U/L (ref 38–126)
Anion gap: 9 (ref 5–15)
BUN: 12 mg/dL (ref 8–23)
CO2: 28 mmol/L (ref 22–32)
Calcium: 9 mg/dL (ref 8.9–10.3)
Chloride: 99 mmol/L (ref 98–111)
Creatinine, Ser: 0.72 mg/dL (ref 0.44–1.00)
GFR, Estimated: >60 mL/min (ref >60)
Glucose, Bld: 118 mg/dL — ABNORMAL HIGH (ref 70–99)
Potassium: 3.9 mmol/L (ref 3.5–5.1)
Sodium: 136 mmol/L (ref 135–145)
Total Bilirubin: 0.7 mg/dL (ref 0.3–1.2)
Total Protein: 6.6 g/dL (ref 6.5–8.1)

## 2021-12-27 LAB — URINALYSIS, MICROSCOPIC (REFLEX)
Bacteria, UA: NONE SEEN
RBC / HPF: NONE SEEN RBC/hpf (ref 0–5)
Squamous Epithelial / HPF: NONE SEEN (ref 0–5)
WBC, UA: NONE SEEN WBC/hpf (ref 0–5)

## 2021-12-27 LAB — URINALYSIS, ROUTINE W REFLEX MICROSCOPIC
Bilirubin Urine: NEGATIVE
Glucose, UA: NEGATIVE mg/dL
Hgb urine dipstick: NEGATIVE
Ketones, ur: NEGATIVE mg/dL
Nitrite: NEGATIVE
Protein, ur: NEGATIVE mg/dL
Specific Gravity, Urine: <1.005 — ABNORMAL LOW (ref 1.005–1.030)
pH: 6 (ref 5.0–8.0)

## 2021-12-27 LAB — CBC
HCT: 41.7 % (ref 36.0–46.0)
Hemoglobin: 13.9 g/dL (ref 12.0–15.0)
MCH: 30.9 pg (ref 26.0–34.0)
MCHC: 33.3 g/dL (ref 30.0–36.0)
MCV: 92.7 fL (ref 80.0–100.0)
Platelets: 213 10*3/uL (ref 150–400)
RBC: 4.5 MIL/uL (ref 3.87–5.11)
RDW: 12.5 % (ref 11.5–15.5)
WBC: 8.1 10*3/uL (ref 4.0–10.5)
nRBC: 0 % (ref 0.0–0.2)

## 2021-12-27 NOTE — ED Triage Notes (Addendum)
Pt states 'I just feel lousy". Pt states she believes an insect bit her scalp, posterior this morning and since that she has had diarrhea and a posterior scalp skin pain. Pt denies other symptoms. Pt appears in no acute distress.

## 2021-12-28 ENCOUNTER — Encounter: Payer: Self-pay | Admitting: Emergency Medicine

## 2021-12-28 ENCOUNTER — Ambulatory Visit
Admission: EM | Admit: 2021-12-28 | Discharge: 2021-12-28 | Disposition: A | Discharge status: Home / Self Care | Payer: Medicare HMO

## 2021-12-28 DIAGNOSIS — R5381 Other malaise: Secondary | ICD-10-CM | POA: Diagnosis not present

## 2021-12-28 DIAGNOSIS — S0006XA Insect bite (nonvenomous) of scalp, initial encounter: Secondary | ICD-10-CM | POA: Diagnosis not present

## 2021-12-28 DIAGNOSIS — R197 Diarrhea, unspecified: Secondary | ICD-10-CM

## 2021-12-28 DIAGNOSIS — W57XXXA Bitten or stung by nonvenomous insect and other nonvenomous arthropods, initial encounter: Secondary | ICD-10-CM | POA: Diagnosis not present

## 2021-12-28 MED ORDER — PREDNISONE 20 MG PO TABS: 40.0000 mg | ORAL_TABLET | Freq: Every day | ORAL | 0 refills | Status: AC

## 2021-12-28 NOTE — Discharge Instructions (Signed)
The insect bite -The area where your insect has bitten you is over a section of your psoriasis which appears to be mildly flared -Take prednisone every morning with food for 5 days to help reduce any inflammation in the area to your scalp -You may follow-up for reevaluation as needed  For your diarrhea -Based on the timeline of your illness and your exam I have a very low suspicion that this is being caused by more serious organ involvement or infection and therefore we will help to minimize her symptoms -It is possible that your frequent diarrhea is the cause of your general fatigue -You may use over-the-counter Imodium to help slow down your diarrhea -Continue to eat food as tolerated and increase your fluid intake until diarrhea has resolved to maintain hydration -If symptoms continue to persist please follow-up with urgent care or your primary doctor

## 2021-12-28 NOTE — ED Provider Notes (Signed)
Renaldo Fiddler    CSN: [id number] Arrival date & time: 12/28/21  1534      History   Chief Complaint Chief Complaint  Patient presents with   Insect Bite    HPI Shirley Ellis is a 86 y.o. female.   Patient presents with concerns for possible insect bite to the back of scalp beginning 1 day ago.  Site feels irritated but denies pruritus or drainage, fever or chills.  Has not attempted treatment.  History of psoriasis.  Patient concerns with feeling lousy with increased fatigue and diarrhea for 5 days.  Diarrhea is described as soft and watery with multiple occurrences throughout the day.  Independent of food consumption.  Has had a decreased appetite which is her baseline but endorses she has increased her fluid intake.  Denies abdominal bloating, heartburn or indigestion, increased gas production, dietary changes, new medication changes, recent travel or known sick contacts.  Has not attempted treatment of symptoms.  Past Medical History:  Diagnosis Date   Anxiety    Thyroid disease     There are no problems to display for this patient.   History reviewed. No pertinent surgical history.  OB History   No obstetric history on file.      Home Medications    Prior to Admission medications   Medication Sig Start Date End Date Taking? Authorizing Provider  aspirin EC 81 MG tablet Take 1 tablet by mouth daily. 10/08/14  Yes [provider]  b complex vitamins capsule Take 1 capsule by mouth daily. 10/08/14  Yes [provider]  levothyroxine (SYNTHROID) 100 MCG tablet Take by mouth. 11/02/21 11/02/22 Yes [provider]  meclizine (ANTIVERT) 25 MG tablet Take 1 tablet (25 mg total) by mouth 3 (three) times daily as needed for dizziness. 01/19/17  Yes Don Perking, [place], MD  predniSONE (DELTASONE) 20 MG tablet Take 2 tablets (40 mg total) by mouth daily. 12/28/21  Yes Obrien Huskins R, NP  buPROPion (WELLBUTRIN XL) 150 MG 24 hr tablet Take  150 mg by mouth daily. 10/13/21   [provider]  busPIRone (BUSPAR) 10 MG tablet Take 10 mg by mouth 2 (two) times daily. 10/12/21   [provider]  metoprolol succinate (TOPROL-XL) 25 MG 24 hr tablet Take 25 mg by mouth daily. 11/24/21   [provider]  mirtazapine (REMERON) 45 MG tablet Take 45 mg by mouth at bedtime. 12/24/21   [provider]  rosuvastatin (CRESTOR) 5 MG tablet Take 5 mg by mouth 3 (three) times a week. 11/08/21   [provider]    Family History Family History  Problem Relation Age of Onset   Breast cancer Other     Social History Social History   Tobacco Use   Smoking status: Never   Smokeless tobacco: Current  Vaping Use   Vaping Use: Never used  Substance Use Topics   Alcohol use: No   Drug use: Never     Allergies   Other   Review of Systems Review of Systems Defer to HPI    Physical Exam Triage Vital Signs ED Triage Vitals  Enc Vitals Group     BP 12/28/21 1600 115/72     Pulse Rate 12/28/21 1600 77     Resp 12/28/21 1600 16     Temp 12/28/21 1600 98.2 F (36.8 C)     Temp Source 12/28/21 1600 Oral     SpO2 12/28/21 1600 95 %     Weight --  Height --      Head Circumference --      Peak Flow --      Pain Score 12/28/21 1555 0     Pain Loc --      Pain Edu? --      Excl. in GC? --    No data found.  Updated Vital Signs BP 115/72 (BP Location: Left Arm)   Pulse 77   Temp 98.2 F (36.8 C) (Oral)   Resp 16   SpO2 95%   Visual Acuity Right Eye Distance:   Left Eye Distance:   Bilateral Distance:    Right Eye Near:   Left Eye Near:    Bilateral Near:     Physical Exam Constitutional:      Appearance: Normal appearance.  HENT:     Head: Normocephalic.     Comments: No lesion is noted to the posterior scalp or nape of neck, within the posterior of the scalp there is psoriasis present Eyes:     Extraocular Movements: Extraocular movements intact.  Pulmonary:     Effort:  Pulmonary effort is normal.  Abdominal:     General: Abdomen is flat. Bowel sounds are normal. There is no distension.     Palpations: Abdomen is soft.     Tenderness: There is no abdominal tenderness. There is no guarding.  Neurological:     Mental Status: She is alert and oriented to person, place, and time. Mental status is at baseline.  Psychiatric:        Mood and Affect: Mood normal.        Behavior: Behavior normal.      UC Treatments / Results  Labs (all labs ordered are listed, but only abnormal results are displayed) Labs Reviewed - No data to display  EKG   Radiology No results found.  Procedures Procedures (including critical care time)  Medications Ordered in UC Medications - No data to display  Initial Impression / Assessment and Plan / UC Course  I have reviewed the triage vital signs and the nursing notes.  Pertinent labs & imaging results that were available during my care of the patient were reviewed by me and considered in my medical decision making (see chart for details).  Malaise, diarrhea Insect bite of scalp, initial encounter  Unknown etiology of diarrhea, low suspicion for an acute abdomen based on timeline of illness, lack of tenderness on exam and accompanying symptoms, low suspicion for infectious cause, discussed with patient, recommended over-the-counter Imodium and can increase fluid intake and maintain hydration, recommended a watchful wait with the urgent care if symptoms persist or worsen  No lesion is noted be a flare of the psoriasis, prednisone 40 mg burst prescribed for management, may follow-up with PCP or urgent care for reevaluation as needed Final Clinical Impressions(s) / UC Diagnoses   Final diagnoses:  Malaise  Diarrhea, unspecified type  Insect bite of scalp, initial encounter     Discharge Instructions      The insect bite -The area where your insect has bitten you is over a section of your psoriasis which appears  to be mildly flared -Take prednisone every morning with food for 5 days to help reduce any inflammation in the area to your scalp -You may follow-up for reevaluation as needed  For your diarrhea -Based on the timeline of your illness and your exam I have a very low suspicion that this is being caused by more serious organ involvement or infection and therefore  we will help to minimize her symptoms -It is possible that your frequent diarrhea is the cause of your general fatigue -You may use over-the-counter Imodium to help slow down your diarrhea -Continue to eat food as tolerated and increase your fluid intake until diarrhea has resolved to maintain hydration -If symptoms continue to persist please follow-up with urgent care or your primary doctor   ED Prescriptions     Medication Sig Dispense Auth. Provider   predniSONE (DELTASONE) 20 MG tablet Take 2 tablets (40 mg total) by mouth daily. 10 tablet Valinda Hoar, NP      PDMP not reviewed this encounter.   Valinda Hoar, NP 12/28/21 1626

## 2021-12-28 NOTE — ED Triage Notes (Signed)
Pt states she was bit by a bug to the back of her head last night. She states " I feel lousy" and "hot inside". She went to the ED last night and got lab work and left without being seen.

## 2022-04-08 ENCOUNTER — Other Ambulatory Visit: Payer: Self-pay | Admitting: Internal Medicine

## 2022-04-08 DIAGNOSIS — Z1231 Encounter for screening mammogram for malignant neoplasm of breast: Secondary | ICD-10-CM

## 2022-06-02 ENCOUNTER — Ambulatory Visit
Admission: RE | Admit: 2022-06-02 | Discharge: 2022-06-02 | Disposition: A | Discharge status: Home / Self Care | Payer: Medicare HMO | Source: Ambulatory Visit | Attending: Internal Medicine | Admitting: Internal Medicine

## 2022-06-02 DIAGNOSIS — Z1231 Encounter for screening mammogram for malignant neoplasm of breast: Secondary | ICD-10-CM | POA: Diagnosis present

## 2023-06-13 ENCOUNTER — Other Ambulatory Visit: Payer: Self-pay | Admitting: Internal Medicine

## 2023-06-13 DIAGNOSIS — Z1231 Encounter for screening mammogram for malignant neoplasm of breast: Secondary | ICD-10-CM

## 2023-07-19 ENCOUNTER — Ambulatory Visit: Payer: Medicare HMO | Admitting: Dermatology

## 2023-07-27 ENCOUNTER — Ambulatory Visit
Admission: RE | Admit: 2023-07-27 | Discharge: 2023-07-27 | Disposition: A | Discharge status: Home / Self Care | Payer: Medicare HMO | Source: Ambulatory Visit | Attending: Internal Medicine | Admitting: Internal Medicine

## 2023-07-27 DIAGNOSIS — Z1231 Encounter for screening mammogram for malignant neoplasm of breast: Secondary | ICD-10-CM | POA: Insufficient documentation
